# Patient Record
Sex: Male | Born: 1960 | Race: White | Hispanic: No | Marital: Married | Smoking: Former smoker
Health system: Southern US, Community
[De-identification: ages and names within clinical notes are randomized; demographics above are authoritative.]

## PROBLEM LIST (undated history)

## (undated) DIAGNOSIS — M109 Gout, unspecified: Secondary | ICD-10-CM

## (undated) DIAGNOSIS — I10 Essential (primary) hypertension: Secondary | ICD-10-CM

## (undated) DIAGNOSIS — Y249XXA Unspecified firearm discharge, undetermined intent, initial encounter: Secondary | ICD-10-CM

## (undated) DIAGNOSIS — K529 Noninfective gastroenteritis and colitis, unspecified: Secondary | ICD-10-CM

## (undated) DIAGNOSIS — E785 Hyperlipidemia, unspecified: Secondary | ICD-10-CM

## (undated) DIAGNOSIS — K219 Gastro-esophageal reflux disease without esophagitis: Secondary | ICD-10-CM

## (undated) DIAGNOSIS — W3400XA Accidental discharge from unspecified firearms or gun, initial encounter: Secondary | ICD-10-CM

## (undated) HISTORY — PX: OTHER SURGICAL HISTORY: SHX169

---

## 2006-05-31 ENCOUNTER — Ambulatory Visit: Payer: Self-pay | Admitting: Unknown Physician Specialty

## 2006-06-19 ENCOUNTER — Ambulatory Visit: Payer: Self-pay | Admitting: Unknown Physician Specialty

## 2012-01-27 ENCOUNTER — Ambulatory Visit: Payer: Self-pay | Admitting: Unknown Physician Specialty

## 2013-02-21 ENCOUNTER — Ambulatory Visit: Payer: Self-pay | Admitting: Unknown Physician Specialty

## 2013-02-25 LAB — PATHOLOGY REPORT

## 2014-05-06 ENCOUNTER — Ambulatory Visit: Payer: Self-pay | Admitting: Unknown Physician Specialty

## 2014-06-16 ENCOUNTER — Ambulatory Visit
Admit: 2014-06-16 | Disposition: A | Discharge status: Home / Self Care | Payer: Self-pay | Attending: Unknown Physician Specialty | Admitting: Unknown Physician Specialty

## 2018-12-05 ENCOUNTER — Other Ambulatory Visit: Payer: Self-pay

## 2018-12-05 DIAGNOSIS — Z20822 Contact with and (suspected) exposure to covid-19: Secondary | ICD-10-CM

## 2018-12-07 LAB — NOVEL CORONAVIRUS, NAA: SARS-CoV-2, NAA: NOT DETECTED

## 2019-01-23 ENCOUNTER — Other Ambulatory Visit: Payer: Self-pay | Admitting: Student

## 2019-01-23 DIAGNOSIS — M545 Low back pain, unspecified: Secondary | ICD-10-CM

## 2019-02-03 ENCOUNTER — Ambulatory Visit: Admission: RE | Admit: 2019-02-03 | Payer: Self-pay | Source: Ambulatory Visit

## 2019-06-05 ENCOUNTER — Ambulatory Visit: Admit: 2019-06-05 | Payer: Self-pay | Admitting: Internal Medicine

## 2019-06-05 SURGERY — COLONOSCOPY WITH PROPOFOL
Anesthesia: General

## 2019-07-14 ENCOUNTER — Emergency Department (HOSPITAL_COMMUNITY): Payer: BLUE CROSS/BLUE SHIELD

## 2019-07-14 ENCOUNTER — Encounter (HOSPITAL_COMMUNITY): Payer: Self-pay | Admitting: Emergency Medicine

## 2019-07-14 ENCOUNTER — Other Ambulatory Visit: Payer: Self-pay

## 2019-07-14 ENCOUNTER — Emergency Department (HOSPITAL_COMMUNITY)
Admission: EM | Admit: 2019-07-14 | Discharge: 2019-07-15 | Disposition: A | Discharge status: Home / Self Care | Payer: BLUE CROSS/BLUE SHIELD | Attending: Emergency Medicine | Admitting: Emergency Medicine

## 2019-07-14 DIAGNOSIS — W3409XA Accidental discharge from other specified firearms, initial encounter: Secondary | ICD-10-CM | POA: Diagnosis not present

## 2019-07-14 DIAGNOSIS — Y929 Unspecified place or not applicable: Secondary | ICD-10-CM | POA: Diagnosis not present

## 2019-07-14 DIAGNOSIS — S81831A Puncture wound without foreign body, right lower leg, initial encounter: Secondary | ICD-10-CM | POA: Diagnosis present

## 2019-07-14 DIAGNOSIS — Y999 Unspecified external cause status: Secondary | ICD-10-CM | POA: Insufficient documentation

## 2019-07-14 DIAGNOSIS — I10 Essential (primary) hypertension: Secondary | ICD-10-CM | POA: Diagnosis not present

## 2019-07-14 DIAGNOSIS — R21 Rash and other nonspecific skin eruption: Secondary | ICD-10-CM | POA: Insufficient documentation

## 2019-07-14 DIAGNOSIS — W3400XA Accidental discharge from unspecified firearms or gun, initial encounter: Secondary | ICD-10-CM

## 2019-07-14 DIAGNOSIS — Y939 Activity, unspecified: Secondary | ICD-10-CM | POA: Insufficient documentation

## 2019-07-14 HISTORY — DX: Noninfective gastroenteritis and colitis, unspecified: K52.9

## 2019-07-14 HISTORY — DX: Essential (primary) hypertension: I10

## 2019-07-14 HISTORY — DX: Hyperlipidemia, unspecified: E78.5

## 2019-07-14 MED ORDER — FENTANYL CITRATE (PF) 100 MCG/2ML IJ SOLN: 75.0000 ug | Freq: Once | INTRAMUSCULAR | Status: DC

## 2019-07-14 MED ORDER — CEPHALEXIN 500 MG PO CAPS: 500.0000 mg | ORAL_CAPSULE | Freq: Four times a day (QID) | ORAL | 0 refills | Status: DC
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
## 2019-07-14 NOTE — ED Notes (Signed)
Pt ambulated in hall. Pt complained of muscle tightness in the hamstring of the R. Leg.
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
## 2019-07-16 ENCOUNTER — Emergency Department (HOSPITAL_COMMUNITY)
Admission: EM | Admit: 2019-07-16 | Discharge: 2019-07-16 | Disposition: A | Discharge status: Home / Self Care | Payer: BLUE CROSS/BLUE SHIELD | Attending: Emergency Medicine | Admitting: Emergency Medicine

## 2019-07-16 ENCOUNTER — Emergency Department (HOSPITAL_COMMUNITY): Payer: BLUE CROSS/BLUE SHIELD

## 2019-07-16 ENCOUNTER — Encounter (HOSPITAL_COMMUNITY): Payer: Self-pay | Admitting: Emergency Medicine

## 2019-07-16 ENCOUNTER — Emergency Department (HOSPITAL_BASED_OUTPATIENT_CLINIC_OR_DEPARTMENT_OTHER): Payer: BLUE CROSS/BLUE SHIELD

## 2019-07-16 DIAGNOSIS — M795 Residual foreign body in soft tissue: Secondary | ICD-10-CM | POA: Diagnosis not present

## 2019-07-16 DIAGNOSIS — I10 Essential (primary) hypertension: Secondary | ICD-10-CM | POA: Insufficient documentation

## 2019-07-16 DIAGNOSIS — W3400XD Accidental discharge from unspecified firearms or gun, subsequent encounter: Secondary | ICD-10-CM | POA: Diagnosis not present

## 2019-07-16 DIAGNOSIS — L538 Other specified erythematous conditions: Secondary | ICD-10-CM | POA: Diagnosis not present

## 2019-07-16 DIAGNOSIS — M79609 Pain in unspecified limb: Secondary | ICD-10-CM

## 2019-07-16 DIAGNOSIS — R52 Pain, unspecified: Secondary | ICD-10-CM
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
## 2019-07-16 NOTE — ED Notes (Signed)
Vascular at bedside
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
## 2019-07-16 NOTE — ED Notes (Signed)
Pt in radiology 

## 2019-07-16 NOTE — Progress Notes (Signed)
Right lower extremity venous duplex complete.  Please see CV proc tab for preliminary results. Lita Mains- RDMS, RVT 8:05 PM  07/16/2019

## 2021-04-05 DIAGNOSIS — R7303 Prediabetes: Secondary | ICD-10-CM | POA: Diagnosis not present

## 2021-04-05 DIAGNOSIS — E538 Deficiency of other specified B group vitamins: Secondary | ICD-10-CM | POA: Diagnosis not present

## 2021-04-06 DIAGNOSIS — G4733 Obstructive sleep apnea (adult) (pediatric): Secondary | ICD-10-CM | POA: Diagnosis not present

## 2021-04-12 DIAGNOSIS — M109 Gout, unspecified: Secondary | ICD-10-CM | POA: Diagnosis not present

## 2021-04-12 DIAGNOSIS — K760 Fatty (change of) liver, not elsewhere classified: Secondary | ICD-10-CM | POA: Diagnosis not present

## 2021-04-12 DIAGNOSIS — E7800 Pure hypercholesterolemia, unspecified: Secondary | ICD-10-CM | POA: Diagnosis not present

## 2021-04-12 DIAGNOSIS — I10 Essential (primary) hypertension: Secondary | ICD-10-CM | POA: Diagnosis not present

## 2021-05-07 DIAGNOSIS — G4733 Obstructive sleep apnea (adult) (pediatric): Secondary | ICD-10-CM | POA: Diagnosis not present

## 2021-05-07 DIAGNOSIS — Z0001 Encounter for general adult medical examination with abnormal findings: Secondary | ICD-10-CM | POA: Diagnosis not present

## 2021-05-07 DIAGNOSIS — Z125 Encounter for screening for malignant neoplasm of prostate: Secondary | ICD-10-CM | POA: Diagnosis not present

## 2021-06-04 DIAGNOSIS — G4733 Obstructive sleep apnea (adult) (pediatric): Secondary | ICD-10-CM | POA: Diagnosis not present

## 2021-06-29 DIAGNOSIS — G4733 Obstructive sleep apnea (adult) (pediatric): Secondary | ICD-10-CM | POA: Diagnosis not present

## 2021-07-01 DIAGNOSIS — K5150 Left sided colitis without complications: Secondary | ICD-10-CM | POA: Diagnosis not present

## 2021-07-05 DIAGNOSIS — G4733 Obstructive sleep apnea (adult) (pediatric): Secondary | ICD-10-CM | POA: Diagnosis not present

## 2021-08-04 DIAGNOSIS — G4733 Obstructive sleep apnea (adult) (pediatric): Secondary | ICD-10-CM | POA: Diagnosis not present
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
## 2021-08-05 DIAGNOSIS — G4733 Obstructive sleep apnea (adult) (pediatric): Secondary | ICD-10-CM | POA: Diagnosis not present

## 2021-08-26 DIAGNOSIS — K5150 Left sided colitis without complications: Secondary | ICD-10-CM | POA: Diagnosis not present

## 2021-09-04 DIAGNOSIS — G4733 Obstructive sleep apnea (adult) (pediatric): Secondary | ICD-10-CM | POA: Diagnosis not present

## 2021-10-04 DIAGNOSIS — G4733 Obstructive sleep apnea (adult) (pediatric): Secondary | ICD-10-CM | POA: Diagnosis not present

## 2021-10-11 DIAGNOSIS — I10 Essential (primary) hypertension: Secondary | ICD-10-CM | POA: Diagnosis not present

## 2021-10-11 DIAGNOSIS — M109 Gout, unspecified: Secondary | ICD-10-CM | POA: Diagnosis not present

## 2021-10-11 DIAGNOSIS — E538 Deficiency of other specified B group vitamins: Secondary | ICD-10-CM | POA: Diagnosis not present

## 2021-10-11 DIAGNOSIS — E7800 Pure hypercholesterolemia, unspecified: Secondary | ICD-10-CM | POA: Diagnosis not present

## 2021-10-11 DIAGNOSIS — K5150 Left sided colitis without complications: Secondary | ICD-10-CM | POA: Diagnosis not present

## 2021-10-11 DIAGNOSIS — R400 Somnolence: Secondary | ICD-10-CM | POA: Diagnosis not present

## 2021-10-11 DIAGNOSIS — Z125 Encounter for screening for malignant neoplasm of prostate: Secondary | ICD-10-CM | POA: Diagnosis not present

## 2021-10-18 DIAGNOSIS — K5150 Left sided colitis without complications: Secondary | ICD-10-CM | POA: Diagnosis not present

## 2021-10-18 DIAGNOSIS — K5130 Ulcerative (chronic) rectosigmoiditis without complications: Secondary | ICD-10-CM | POA: Diagnosis not present

## 2021-10-18 DIAGNOSIS — Z79899 Other long term (current) drug therapy: Secondary | ICD-10-CM | POA: Diagnosis not present

## 2021-11-04 DIAGNOSIS — G4733 Obstructive sleep apnea (adult) (pediatric): Secondary | ICD-10-CM | POA: Diagnosis not present

## 2021-12-05 DIAGNOSIS — G4733 Obstructive sleep apnea (adult) (pediatric): Secondary | ICD-10-CM | POA: Diagnosis not present

## 2022-01-04 DIAGNOSIS — G4733 Obstructive sleep apnea (adult) (pediatric): Secondary | ICD-10-CM | POA: Diagnosis not present

## 2022-01-10 ENCOUNTER — Ambulatory Visit: Payer: 59 | Admitting: Dermatology

## 2022-01-10 DIAGNOSIS — B353 Tinea pedis: Secondary | ICD-10-CM

## 2022-01-10 DIAGNOSIS — Z79899 Other long term (current) drug therapy: Secondary | ICD-10-CM | POA: Diagnosis not present

## 2022-01-10 DIAGNOSIS — L821 Other seborrheic keratosis: Secondary | ICD-10-CM

## 2022-01-10 DIAGNOSIS — B351 Tinea unguium: Secondary | ICD-10-CM | POA: Diagnosis not present

## 2022-01-10 DIAGNOSIS — D229 Melanocytic nevi, unspecified: Secondary | ICD-10-CM | POA: Diagnosis not present

## 2022-01-10 DIAGNOSIS — D3610 Benign neoplasm of peripheral nerves and autonomic nervous system, unspecified: Secondary | ICD-10-CM

## 2022-01-10 DIAGNOSIS — D3612 Benign neoplasm of peripheral nerves and autonomic nervous system, upper limb, including shoulder: Secondary | ICD-10-CM

## 2022-01-10 DIAGNOSIS — Z1283 Encounter for screening for malignant neoplasm of skin: Secondary | ICD-10-CM | POA: Diagnosis not present

## 2022-01-10 DIAGNOSIS — L814 Other melanin hyperpigmentation: Secondary | ICD-10-CM

## 2022-01-10 DIAGNOSIS — Z808 Family history of malignant neoplasm of other organs or systems: Secondary | ICD-10-CM

## 2022-01-10 DIAGNOSIS — L820 Inflamed seborrheic keratosis: Secondary | ICD-10-CM

## 2022-01-10 DIAGNOSIS — L578 Other skin changes due to chronic exposure to nonionizing radiation: Secondary | ICD-10-CM

## 2022-01-10 MED ORDER — TERBINAFINE HCL 250 MG PO TABS: 250.0000 mg | ORAL_TABLET | Freq: Every day | ORAL | 0 refills | Status: AC

## 2022-01-10 NOTE — Patient Instructions (Addendum)
Terbinafine Counseling  Terbinafine is an anti-fungal medicine that can be applied to the skin (over the counter) or taken by mouth (prescription) to treat fungal infections. The pill version is often used to treat fungal infections of the nails or scalp. While most people do not have any side effects from taking terbinafine pills, some possible side effects of the medicine can include taste changes, headache, loss of smell, vision changes, nausea, vomiting, or diarrhea.   Rare side effects can include irritation of the liver, allergic reaction, or decrease in blood counts (which may show up as not feeling well or developing an infection). If you are concerned about any of these side effects, please stop the medicine and call your doctor, or in the case of an emergency such as feeling very unwell, seek immediate medical care.      Seborrheic Keratosis  What causes seborrheic keratoses? Seborrheic keratoses are harmless, common skin growths that first appear during adult life.  As time goes by, more growths appear.  Some people may develop a large number of them.  Seborrheic keratoses appear on both covered and uncovered body parts.  They are not caused by sunlight.  The tendency to develop seborrheic keratoses can be inherited.  They vary in color from skin-colored to gray, brown, or even black.  They can be either smooth or have a rough, warty surface.   Seborrheic keratoses are superficial and look as if they were stuck on the skin.  Under the microscope this type of keratosis looks like layers upon layers of skin.  That is why at times the top layer may seem to fall off, but the rest of the growth remains and re-grows.    Treatment Seborrheic keratoses do not need to be treated, but can easily be removed in the office.  Seborrheic keratoses often cause symptoms when they rub on clothing or jewelry.  Lesions can be in the way of shaving.  If they become inflamed, they can cause itching, soreness,  or burning.  Removal of a seborrheic keratosis can be accomplished by freezing, burning, or surgery. If any spot bleeds, scabs, or grows rapidly, please return to have it checked, as these can be an indication of a skin cancer.  Cryotherapy Aftercare  Wash gently with soap and water everyday.   Apply Vaseline and Band-Aid daily until healed.     Melanoma ABCDEs  Melanoma is the most dangerous type of skin cancer, and is the leading cause of death from skin disease.  You are more likely to develop melanoma if you: Have light-colored skin, light-colored eyes, or red or blond hair Spend a lot of time in the sun Tan regularly, either outdoors or in a tanning bed Have had blistering sunburns, especially during childhood Have a close family member who has had a melanoma Have atypical moles or large birthmarks  Early detection of melanoma is key since treatment is typically straightforward and cure rates are extremely high if we catch it early.   The first sign of melanoma is often a change in a mole or a new dark spot.  The ABCDE system is a way of remembering the signs of melanoma.  A for asymmetry:  The two halves do not match. B for border:  The edges of the growth are irregular. C for color:  A mixture of colors are present instead of an even brown color. D for diameter:  Melanomas are usually (but not always) greater than 40m - the size of a  pencil eraser. E for evolution:  The spot keeps changing in size, shape, and color.  Please check your skin once per month between visits. You can use a small mirror in front and a large mirror behind you to keep an eye on the back side or your body.   If you see any new or changing lesions before your next follow-up, please call to schedule a visit.  Please continue daily skin protection including broad spectrum sunscreen SPF 30+ to sun-exposed areas, reapplying every 2 hours as needed when you're outdoors.   Staying in the shade or wearing  long sleeves, sun glasses (UVA+UVB protection) and wide brim hats (4-inch brim around the entire circumference of the hat) are also recommended for sun protection.    Due to recent changes in healthcare laws, you may see results of your pathology and/or laboratory studies on MyChart before the doctors have had a chance to review them. We understand that in some cases there may be results that are confusing or concerning to you. Please understand that not all results are received at the same time and often the doctors may need to interpret multiple results in order to provide you with the best plan of care or course of treatment. Therefore, we ask that you please give [place] 2 business days to thoroughly review all your results before contacting the office for clarification. Should we see a critical lab result, you will be contacted sooner.   If You Need Anything After Your Visit  If you have any questions or concerns for your doctor, please call our main line at [phone] and press option 4 to reach your doctor's medical assistant. If no one answers, please leave a voicemail as directed and we will return your call as soon as possible. Messages left after 4 pm will be answered the following business day.   You may also send [place] a message via [place]. We typically respond to MyChart messages within 1-2 business days.  For prescription refills, please ask your pharmacy to contact our office. Our fax number is [phone].  If you have an urgent issue when the clinic is closed that cannot wait until the next business day, you can page your doctor at the number below.    Please note that while we do our best to be available for urgent issues outside of office hours, we are not available 24/7.   If you have an urgent issue and are unable to reach [place], you may choose to seek medical care at your doctor's office, retail clinic, urgent care center, or emergency room.  If you have a medical emergency, please  immediately call 911 or go to the emergency department.  Pager Numbers  - Dr. Nehemiah Massed: [phone]  - Dr. Laurence Ferrari: [phone]  - Dr. Nicole Kindred: [phone]  In the event of inclement weather, please call our main line at [phone] for an update on the status of any delays or closures.  Dermatology Medication Tips: Please keep the boxes that topical medications come in in order to help keep track of the instructions about where and how to use these. Pharmacies typically print the medication instructions only on the boxes and not directly on the medication tubes.   If your medication is too expensive, please contact our office at [phone] option 4 or send [place] a message through [place].   We are unable to tell what your co-pay for medications will be in advance as this is different depending on your insurance coverage. However,  we may be able to find a substitute medication at lower cost or fill out paperwork to get insurance to cover a needed medication.   If a prior authorization is required to get your medication covered by your insurance company, please allow [place] 1-2 business days to complete this process.  Drug prices often vary depending on where the prescription is filled and some pharmacies may offer cheaper prices.  The website www.goodrx.com contains coupons for medications through different pharmacies. The prices here do not account for what the cost may be with help from insurance (it may be cheaper with your insurance), but the website can give you the price if you did not use any insurance.  - You can print the associated coupon and take it with your prescription to the pharmacy.  - You may also stop by our office during regular business hours and pick up a GoodRx coupon card.  - If you need your prescription sent electronically to a different pharmacy, notify our office through [facility] or by phone at [phone] option 4.     Si Usted Necesita Algo Despus  de Su Visita  Tambin puede enviarnos un mensaje a travs de Pharmacist, community. Por lo general respondemos a los mensajes de MyChart en el transcurso de 1 a 2 das hbiles.  Para renovar recetas, por favor pida a su farmacia que se ponga en contacto con nuestra oficina. Harland Dingwall de fax es [place] [phone].  Si tiene un asunto urgente cuando la clnica est cerrada y que no puede esperar hasta el siguiente da hbil, puede llamar/localizar a su doctor(a) al nmero que aparece a continuacin.   Por favor, tenga en cuenta que aunque hacemos todo lo posible para estar disponibles para asuntos urgentes fuera del horario de [place], no estamos disponibles las 24 horas del da, los 7 das de la [place].   Si tiene un problema urgente y no puede comunicarse con nosotros, puede optar por buscar atencin mdica  en el consultorio de su doctor(a), en una clnica privada, en un centro de atencin urgente o en una sala de emergencias.  Si tiene Engineering geologist, por favor llame inmediatamente al 911 o vaya a la sala de emergencias.  Nmeros de bper  - Dr. Nehemiah Massed: [phone]  - Dra. Moye: [phone]  - Dra. Nicole Kindred: [phone]  En caso de inclemencias del [place], por favor llame a Johnsie Kindred principal al [phone] para una actualizacin sobre el [place] de cualquier retraso o cierre.  Consejos para la medicacin en dermatologa: Por favor, guarde las cajas en las que vienen los medicamentos de uso tpico para ayudarle a seguir las instrucciones sobre dnde y cmo usarlos. Las farmacias generalmente imprimen las instrucciones del medicamento slo en las cajas y no directamente en los tubos del [place].   Si su medicamento es muy caro, por favor, pngase en contacto con Zigmund Daniel llamando al [phone] y presione la opcin 4 o envenos un mensaje a travs de Pharmacist, community.   No podemos decirle cul ser su copago por los medicamentos por adelantado ya que esto es diferente dependiendo  de la cobertura de su seguro. Sin embargo, es posible que podamos encontrar un medicamento sustituto a Electrical engineer un formulario para que el seguro cubra el medicamento que se considera necesario.   Si se requiere una autorizacin previa para que su compaa de seguros [place] su medicamento, por favor permtanos de 1 a 2 das hbiles para completar este proceso.  Los precios de los  medicamentos varan con frecuencia dependiendo del lugar de dnde se surte la receta y alguna farmacias pueden ofrecer precios ms baratos.  El sitio web www.goodrx.com tiene cupones para medicamentos de Airline pilot. Los precios aqu no tienen en cuenta lo que podra costar con la ayuda del seguro (puede ser ms barato con su seguro), pero el sitio web puede darle el precio si no utiliz Research scientist (physical sciences).  - Puede imprimir el cupn correspondiente y llevarlo con su receta a la farmacia.  - Tambin puede pasar por nuestra oficina durante el horario de atencin regular y Charity fundraiser una tarjeta de cupones de GoodRx.  - Si necesita que su receta se enve electrnicamente a una farmacia diferente, informe a nuestra oficina a travs de MyChart de [place] o por telfono llamando al [phone] y presione la opcin 4.

## 2022-01-10 NOTE — Progress Notes (Unsigned)
New Patient Visit  Subjective  Larry Spencer is a 61 y.o. male who presents for the following: Annual Exam (Tbse, dad had some skin cancer, brother skin cancer. ). The patient presents for Total-Body Skin Exam (TBSE) for skin cancer screening and mole check.  The patient has spots, moles and lesions to be evaluated, some may be new or changing and the patient has concerns that these could be cancer.  The following portions of the chart were reviewed this encounter and updated as appropriate:   Allergies  Meds  Problems  Med Hx  Surg Hx  Fam Hx     Review of Systems:  No other skin or systemic complaints except as noted in HPI or Assessment and Plan.  Objective  Well appearing patient in no apparent distress; mood and affect are within normal limits.  A full examination was performed including scalp, head, eyes, ears, nose, lips, neck, chest, axillae, abdomen, back, buttocks, bilateral upper extremities, bilateral lower extremities, hands, feet, fingers, toes, fingernails, and toenails. All findings within normal limits unless otherwise noted below.  right vertex scalp x 1, left lateral scapula  x 1 (2) Erythematous stuck-on, waxy papule or plaque  b/l feet and toenails Scaling and maceration web spaces and over distal and lateral soles.    Assessment & Plan  Inflamed seborrheic keratosis (2) right vertex scalp x 1, left lateral scapula  x 1 Symptomatic, irritating, patient would like treated. Destruction of lesion - right vertex scalp x 1, left lateral scapula  x 1 Complexity: simple   Destruction method: cryotherapy   Informed consent: discussed and consent obtained   Timeout:  patient name, date of birth, surgical site, and procedure verified Lesion destroyed using liquid nitrogen: Yes   Region frozen until ice ball extended beyond lesion: Yes   Outcome: patient tolerated procedure well with no complications   Post-procedure details: wound care instructions given    Additional details:  Prior to procedure, discussed risks of blister formation, small wound, skin dyspigmentation, or rare scar following cryotherapy. Recommend Vaseline ointment to treated areas while healing.  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 Condition is symptomatic / bothersome to patient. Not to goal.  Reviewed patient most recent labs - normal   Start Terbinafine 250 mg tab - take 1 tab by mouth daily for 1 month.   Patient instructed to call after finishing medication.   Terbinafine Counseling  Terbinafine is an anti-fungal medicine that can be applied to the skin (over the counter) or taken by mouth (prescription) to treat fungal infections. The pill version is often used to treat fungal infections of the nails or scalp. While most people do not have any side effects from taking terbinafine pills, some possible side effects of the medicine can include taste changes, headache, loss of smell, vision changes, nausea, vomiting, or diarrhea.   Rare side effects can include irritation of the liver, allergic reaction, or decrease in blood counts (which may show up as not feeling well or developing an infection). If you are concerned about any of these side effects, please stop the medicine and call your doctor, or in the case of an emergency such as feeling very unwell, seek immediate medical care.   terbinafine (LAMISIL) 250 MG tablet - b/l feet and toenails Take 1 tablet (250 mg total) by mouth daily.  Neurofibroma Left Shoulder - Anterior Benign-appearing.  Observation.  Call clinic for new or changing lesions.  Recommend daily use of broad spectrum spf 30+ sunscreen to sun-exposed areas.    Lentigines - Scattered tan macules - Due to sun exposure - Benign-appearing, observe - Recommend daily broad spectrum sunscreen SPF 30+ to sun-exposed areas, reapply every 2 hours  as needed. - Call for any changes  Seborrheic Keratoses - Stuck-on, waxy, tan-brown papules and/or plaques  - Benign-appearing - Discussed benign etiology and prognosis. - Observe - Call for any changes  Melanocytic Nevi - Tan-brown and/or pink-flesh-colored symmetric macules and papules - Benign appearing on exam today - Observation - Call clinic for new or changing moles - Recommend daily use of broad spectrum spf 30+ sunscreen to sun-exposed areas.   Hemangiomas - Red papules - Discussed benign nature - Observe - Call for any changes  Actinic Damage - Chronic condition, secondary to cumulative UV/sun exposure - diffuse scaly erythematous macules with underlying dyspigmentation - Recommend daily broad spectrum sunscreen SPF 30+ to sun-exposed areas, reapply every 2 hours as needed.  - Staying in the shade or wearing long sleeves, sun glasses (UVA+UVB protection) and wide brim hats (4-inch brim around the entire circumference of the hat) are also recommended for sun protection.  - Call for new or changing lesions.  Family history of skin cancer - what type(s): unsure  - who affected: dad and brother   Skin cancer screening performed today. Return in about 1 year (around 01/11/2023) for TBSE.  [name], CMA, am acting as scribe for Sarina Ser, MD. Documentation: I have reviewed the above documentation for accuracy and completeness, and I agree with the above.  Sarina Ser, MD

## 2022-01-11 ENCOUNTER — Encounter: Payer: Self-pay | Admitting: Dermatology

## 2022-01-17 DIAGNOSIS — G8929 Other chronic pain: Secondary | ICD-10-CM | POA: Diagnosis not present

## 2022-01-17 DIAGNOSIS — M19041 Primary osteoarthritis, right hand: Secondary | ICD-10-CM | POA: Diagnosis not present

## 2022-01-17 DIAGNOSIS — M19042 Primary osteoarthritis, left hand: Secondary | ICD-10-CM | POA: Diagnosis not present

## 2022-01-17 DIAGNOSIS — M79641 Pain in right hand: Secondary | ICD-10-CM | POA: Diagnosis not present

## 2022-01-17 DIAGNOSIS — M79642 Pain in left hand: Secondary | ICD-10-CM | POA: Diagnosis not present

## 2022-01-28 DIAGNOSIS — H52223 Regular astigmatism, bilateral: Secondary | ICD-10-CM | POA: Diagnosis not present

## 2022-01-28 DIAGNOSIS — H353131 Nonexudative age-related macular degeneration, bilateral, early dry stage: Secondary | ICD-10-CM | POA: Diagnosis not present

## 2022-01-28 DIAGNOSIS — H2513 Age-related nuclear cataract, bilateral: Secondary | ICD-10-CM | POA: Diagnosis not present

## 2022-01-28 DIAGNOSIS — H524 Presbyopia: Secondary | ICD-10-CM | POA: Diagnosis not present

## 2022-01-28 DIAGNOSIS — H5203 Hypermetropia, bilateral: Secondary | ICD-10-CM | POA: Diagnosis not present

## 2022-02-04 DIAGNOSIS — G4733 Obstructive sleep apnea (adult) (pediatric): Secondary | ICD-10-CM | POA: Diagnosis not present

## 2022-03-06 DIAGNOSIS — G4733 Obstructive sleep apnea (adult) (pediatric): Secondary | ICD-10-CM | POA: Diagnosis not present

## 2022-04-06 DIAGNOSIS — G4733 Obstructive sleep apnea (adult) (pediatric): Secondary | ICD-10-CM | POA: Diagnosis not present

## 2022-04-07 DIAGNOSIS — K5150 Left sided colitis without complications: Secondary | ICD-10-CM | POA: Diagnosis not present

## 2022-05-18 ENCOUNTER — Encounter: Payer: Self-pay | Admitting: Gastroenterology
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
## 2022-05-19 ENCOUNTER — Encounter: Payer: Self-pay | Admitting: Gastroenterology

## 2022-05-19 ENCOUNTER — Ambulatory Visit: Payer: 59 | Admitting: Certified Registered Nurse Anesthetist

## 2022-05-19 ENCOUNTER — Other Ambulatory Visit: Payer: Self-pay

## 2022-05-19 ENCOUNTER — Encounter
Admission: RE | Disposition: A | Discharge status: Home / Self Care | Payer: Self-pay | Source: Home / Self Care | Attending: Gastroenterology

## 2022-05-19 ENCOUNTER — Ambulatory Visit
Admission: RE | Admit: 2022-05-19 | Discharge: 2022-05-19 | Disposition: A | Discharge status: Home / Self Care | Payer: 59 | Attending: Gastroenterology | Admitting: Gastroenterology

## 2022-05-19 DIAGNOSIS — M109 Gout, unspecified: Secondary | ICD-10-CM | POA: Insufficient documentation

## 2022-05-19 DIAGNOSIS — K644 Residual hemorrhoidal skin tags: Secondary | ICD-10-CM | POA: Insufficient documentation

## 2022-05-19 DIAGNOSIS — K219 Gastro-esophageal reflux disease without esophagitis: Secondary | ICD-10-CM | POA: Insufficient documentation

## 2022-05-19 DIAGNOSIS — E785 Hyperlipidemia, unspecified: Secondary | ICD-10-CM | POA: Insufficient documentation

## 2022-05-19 DIAGNOSIS — Z79899 Other long term (current) drug therapy: Secondary | ICD-10-CM | POA: Diagnosis not present

## 2022-05-19 DIAGNOSIS — K635 Polyp of colon: Secondary | ICD-10-CM | POA: Diagnosis not present

## 2022-05-19 DIAGNOSIS — Z09 Encounter for follow-up examination after completed treatment for conditions other than malignant neoplasm: Secondary | ICD-10-CM | POA: Insufficient documentation

## 2022-05-19 DIAGNOSIS — Z87891 Personal history of nicotine dependence: Secondary | ICD-10-CM | POA: Insufficient documentation

## 2022-05-19 DIAGNOSIS — D120 Benign neoplasm of cecum: Secondary | ICD-10-CM | POA: Insufficient documentation

## 2022-05-19 DIAGNOSIS — L918 Other hypertrophic disorders of the skin: Secondary | ICD-10-CM | POA: Diagnosis not present

## 2022-05-19 DIAGNOSIS — K5150 Left sided colitis without complications: Secondary | ICD-10-CM | POA: Insufficient documentation

## 2022-05-19 DIAGNOSIS — I10 Essential (primary) hypertension: Secondary | ICD-10-CM | POA: Diagnosis not present

## 2022-05-19 DIAGNOSIS — K640 First degree hemorrhoids: Secondary | ICD-10-CM | POA: Insufficient documentation

## 2022-05-19 DIAGNOSIS — K5730 Diverticulosis of large intestine without perforation or abscess without bleeding: Secondary | ICD-10-CM | POA: Diagnosis not present

## 2022-05-19 DIAGNOSIS — Z1211 Encounter for screening for malignant neoplasm of colon: Secondary | ICD-10-CM | POA: Insufficient documentation

## 2022-05-19 DIAGNOSIS — K5130 Ulcerative (chronic) rectosigmoiditis without complications: Secondary | ICD-10-CM | POA: Insufficient documentation

## 2022-05-19 DIAGNOSIS — K649 Unspecified hemorrhoids: Secondary | ICD-10-CM | POA: Diagnosis not present

## 2022-05-19 HISTORY — PX: COLONOSCOPY: SHX5424

## 2022-05-19 HISTORY — DX: Unspecified firearm discharge, undetermined intent, initial encounter: Y24.9XXA

## 2022-05-19 HISTORY — DX: Gout, unspecified: M10.9

## 2022-05-19 HISTORY — DX: Gastro-esophageal reflux disease without esophagitis: K21.9

## 2022-05-19 HISTORY — DX: Accidental discharge from unspecified firearms or gun, initial encounter: W34.00XA

## 2022-05-19 SURGERY — COLONOSCOPY
Anesthesia: General

## 2022-05-19 MED ORDER — SODIUM CHLORIDE 0.9 % IV SOLN: INTRAVENOUS | Status: DC

## 2022-05-19 MED ORDER — LIDOCAINE HCL (PF) 2 % IJ SOLN
INTRAMUSCULAR | Status: AC
  Filled 2022-05-19: qty 5

## 2022-05-19 MED ORDER — PROPOFOL 500 MG/50ML IV EMUL
INTRAVENOUS | Status: DC | PRN
  Administered 2022-05-19: 150 ug/kg/min via INTRAVENOUS

## 2022-05-19 MED ORDER — PROPOFOL 1000 MG/100ML IV EMUL
INTRAVENOUS | Status: AC
  Filled 2022-05-19: qty 100

## 2022-05-19 MED ORDER — PROPOFOL 10 MG/ML IV BOLUS
INTRAVENOUS | Status: DC | PRN
  Administered 2022-05-19: 20 mg via INTRAVENOUS
  Administered 2022-05-19: 90 mg via INTRAVENOUS

## 2022-05-19 MED ORDER — LIDOCAINE HCL (CARDIAC) PF 100 MG/5ML IV SOSY
PREFILLED_SYRINGE | INTRAVENOUS | Status: DC | PRN
  Administered 2022-05-19: 50 mg via INTRAVENOUS

## 2022-05-19 NOTE — Anesthesia Procedure Notes (Signed)
Date/Time: 05/19/2022 7:26 AM  Performed by: Johnna Acosta, CRNAPre-anesthesia Checklist: Emergency Drugs available, Patient identified, Suction available, Patient being monitored and Timeout performed Patient Re-evaluated:Patient Re-evaluated prior to induction Oxygen Delivery Method: Nasal cannula Preoxygenation: Pre-oxygenation with 100% oxygen Induction Type: IV induction
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
## 2022-05-19 NOTE — Op Note (Signed)
[facility] Gastroenterology Patient Name: Larry Spencer Procedure Date: 05/19/2022 6:55 AM MRN: [id number] Account #: [id number] Date of Birth: 06-14-1960 Admit Type: Outpatient Age: 62 Room: [facility] ENDO ROOM 1 Gender: Male Note Status: Finalized Instrument Name: Peds Colonoscope [id number] Procedure:             Colonoscopy Indications:           High risk colon cancer surveillance: Ulcerative left                         sided colitis of 8 (or more) years duration Providers:             Rueben Bash, DO Referring MD:          Annamaria Helling DO, DO (Referring MD), No Local                         Md, MD (Referring MD) Medicines:             Monitored Anesthesia Care Complications:         No immediate complications. Estimated blood loss:                         Minimal. Procedure:             Pre-Anesthesia Assessment:                        - Prior to the procedure, a History and Physical was                         performed, and patient medications and allergies were                         reviewed. The patient is competent. The risks and                         benefits of the procedure and the sedation options and                         risks were discussed with the patient. All questions                         were answered and informed consent was obtained.                         Patient identification and proposed procedure were                         verified by the physician, the nurse, the anesthetist                         and the technician in the endoscopy suite. Mental                         Status Examination: alert and oriented. Airway                         Examination: normal oropharyngeal airway and neck  mobility. Respiratory Examination: clear to                         auscultation. CV Examination: RRR, no murmurs, no S3                         or S4. Prophylactic Antibiotics: The patient does  not                         require prophylactic antibiotics. Prior                         Anticoagulants: The patient has taken no anticoagulant                         or antiplatelet agents. ASA Grade Assessment: II - A                         patient with mild systemic disease. After reviewing                         the risks and benefits, the patient was deemed in                         satisfactory condition to undergo the procedure. The                         anesthesia plan was to use monitored anesthesia care                         (MAC). Immediately prior to administration of                         medications, the patient was re-assessed for adequacy                         to receive sedatives. The heart rate, respiratory                         rate, oxygen saturations, blood pressure, adequacy of                         pulmonary ventilation, and response to care were                         monitored throughout the procedure. The physical                         status of the patient was re-assessed after the                         procedure.                        After obtaining informed consent, the colonoscope was                         passed under direct vision. Throughout the procedure,  the patient's blood pressure, pulse, and oxygen                         saturations were monitored continuously. The                         Colonoscope was introduced through the anus and                         advanced to the the terminal ileum, with                         identification of the appendiceal orifice and IC                         valve. The colonoscopy was performed without                         difficulty. The patient tolerated the procedure well.                         The quality of the bowel preparation was evaluated                         using the BBPS [facility] Bowel Preparation Scale) with                         scores of:  Right Colon = 2 (minor amount of residual                         staining, small fragments of stool and/or opaque                         liquid, but mucosa seen well), Transverse Colon = 3                         (entire mucosa seen well with no residual staining,                         small fragments of stool or opaque liquid) and Left                         Colon = 3 (entire mucosa seen well with no residual                         staining, small fragments of stool or opaque liquid).                         The total BBPS score equals 8. The quality of the                         bowel preparation was excellent. The terminal ileum,                         ileocecal valve, appendiceal orifice, and rectum were  photographed. Findings:      Skin tags were found on perianal exam.      The digital rectal exam was normal. Pertinent negatives include normal       sphincter tone.      The terminal ileum appeared normal. Estimated blood loss: none.      A 2 to 3 mm polyp was found in the cecum. The polyp was sessile. The       polyp was removed with a cold snare. Resection and retrieval were       complete. Estimated blood loss was minimal.      A 1 mm polyp was found in the cecum. The polyp was sessile. The polyp       was removed with a cold biopsy forceps. Resection and retrieval were       complete. Estimated blood loss was minimal.      Normal mucosa was found in the rectum, in the descending colon, in the       transverse colon, in the ascending colon and in the cecum. Biopsies were       taken with a cold forceps for histology. Random biospies taken for       disease surveillance and dysplasia surveillance      Multiple small-mouthed diverticula were found in the recto-sigmoid colon       and sigmoid colon. Estimated blood loss: none.      The mucosa vascular pattern in the recto-sigmoid colon and in the       sigmoid colon was locally decreased. This was  biopsied with a cold       forceps for ulcerative colitis surveillance. Estimated blood loss was       minimal.      A 24 mm polypoid lesion was found at 25 cm proximal to the anus. The       lesion was ulcerated and granular lateral spreading. No bleeding was       present. Biopsies were taken with a cold forceps for histology. This       lesion went around ~75% of the lumen and narrowed the lumen. Area was       tattooed with an injection of 1 mL of [place] ink. Tattoo placed proximal       to the lesion Estimated blood loss: none.      Non-bleeding internal hemorrhoids were found during retroflexion. The       hemorrhoids were Grade I (internal hemorrhoids that do not prolapse).       Estimated blood loss: none.      The exam was otherwise without abnormality on direct and retroflexion       views. Impression:            - Perianal skin tags found on perianal exam.                        - The examined portion of the ileum was normal.                        - One 2 to 3 mm polyp in the cecum, removed with a                         cold snare. Resected and retrieved.                        -  One 1 mm polyp in the cecum, removed with a cold                         biopsy forceps. Resected and retrieved.                        - Normal mucosa in the rectum, in the descending                         colon, in the transverse colon, in the ascending colon                         and in the cecum. Biopsied.                        - Diverticulosis in the recto-sigmoid colon and in the                         sigmoid colon.                        - Decreased mucosa vascular pattern in the                         recto-sigmoid colon and in the sigmoid colon. Biopsied.                        - Rule out malignancy, polypoid lesion at 25 cm                         proximal to the anus. Biopsied. Tattooed.                        - Non-bleeding internal hemorrhoids.                        - The  examination was otherwise normal on direct and                         retroflexion views. Recommendation:        - Patient has a contact number available for                         emergencies. The signs and symptoms of potential                         delayed complications were discussed with the patient.                         Return to normal activities tomorrow. Written                         discharge instructions were provided to the patient.                        - Discharge patient to home.                        - Resume previous diet.                        -  Continue present medications.                        - Await pathology results.                        - Repeat colonoscopy for surveillance based on                         pathology results.                        - Return to GI office as previously scheduled.                        - The findings and recommendations were discussed with                         the patient. Procedure Code(s):     --- Professional ---                        [phone], Colonoscopy, flexible; with removal of                         tumor(s), polyp(s), or other lesion(s) by snare                         technique                        45380, 59, Colonoscopy, flexible; with biopsy, single                         or multiple                        45381, Colonoscopy, flexible; with directed submucosal                         injection(s), any substance Diagnosis Code(s):     --- Professional ---                        K51.50, Left sided colitis without complications                        K64.0, First degree hemorrhoids                        D12.0, Benign neoplasm of cecum                        D49.0, Neoplasm of unspecified behavior of digestive                         system                        K64.4, Residual hemorrhoidal skin tags                        K57.30, Diverticulosis of large intestine without  perforation or abscess without bleeding CPT copyright 2022 American Medical Association. All rights reserved. The codes documented in this report are preliminary and upon coder review may  be revised to meet current compliance requirements. Attending Participation:      I personally performed the entire procedure. Volney American, DO Annamaria Helling DO, DO 05/19/2022 8:11:19 AM This report has been signed electronically. Number of Addenda: 0 Note Initiated On: 05/19/2022 6:55 AM Scope Withdrawal Time: 0 hours 31 minutes 50 seconds  Total Procedure Duration: 0 hours 37 minutes 22 seconds  Estimated Blood Loss:  Estimated blood loss was minimal.      [facility]

## 2022-05-19 NOTE — Interval H&P Note (Signed)
History and Physical Interval Note: Preprocedure H&P from 05/19/22  was reviewed and there was no interval change after seeing and examining the patient.  Written consent was obtained from the patient after discussion of risks, benefits, and alternatives. 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  The various methods of treatment have been discussed with the patient and family. After consideration of risks, benefits and other options for treatment, the patient has consented to  Procedure(s): COLONOSCOPY (N/A) as a surgical intervention.  The patient's history has been reviewed, patient examined, no change in status, stable for surgery.  I have reviewed the patient's chart and labs.  Questions were answered to the patient's satisfaction.     Annamaria Helling

## 2022-05-19 NOTE — Anesthesia Postprocedure Evaluation (Signed)
Anesthesia Post Note  Patient: Larry Spencer  Procedure(s) Performed: COLONOSCOPY  Patient location during evaluation: Endoscopy Anesthesia Type: General Level of consciousness: awake and alert Pain management: pain level controlled Vital Signs Assessment: post-procedure vital signs reviewed and stable Respiratory status: spontaneous breathing, nonlabored ventilation and respiratory function stable Cardiovascular status: blood pressure returned to baseline and stable Postop Assessment: no apparent nausea or vomiting Anesthetic complications: no   No notable events documented.   Last Vitals:  Vitals:   05/19/22 0812 05/19/22 0822  BP:    Pulse: 71 69  Resp:    Temp:    SpO2: 100% 100%    Last Pain:  Vitals:   05/19/22 0822  TempSrc:   PainSc: 0-No pain                 Alphonsus Sias

## 2022-05-19 NOTE — Transfer of Care (Signed)
Immediate Anesthesia Transfer of Care Note  Patient: Larry Spencer  Procedure(s) Performed: COLONOSCOPY  Patient Location: Endoscopy Unit  Anesthesia Type:General  Level of Consciousness: awake and drowsy  Airway & Oxygen Therapy: Patient Spontanous Breathing  Post-op Assessment: Report given to RN and Post -op Vital signs reviewed and stable  Post vital signs: Reviewed and stable  Last Vitals:  Vitals Value Taken Time  BP 125/93 05/19/22 0804  Temp 36.1 C 05/19/22 0802  Pulse 73 05/19/22 0802  Resp 16 05/19/22 0804  SpO2 97 % 05/19/22 0802    Last Pain:  Vitals:   05/19/22 0802  TempSrc: Temporal  PainSc: 0-No pain         Complications: No notable events documented.

## 2022-05-20 ENCOUNTER — Encounter: Payer: Self-pay | Admitting: Gastroenterology

## 2022-05-20 LAB — SURGICAL PATHOLOGY

## 2022-06-02 DIAGNOSIS — K5150 Left sided colitis without complications: Secondary | ICD-10-CM | POA: Diagnosis not present

## 2022-07-28 DIAGNOSIS — K5150 Left sided colitis without complications: Secondary | ICD-10-CM | POA: Diagnosis not present

## 2022-09-06 DIAGNOSIS — K5150 Left sided colitis without complications: Secondary | ICD-10-CM | POA: Diagnosis not present

## 2022-09-06 DIAGNOSIS — Z79899 Other long term (current) drug therapy: Secondary | ICD-10-CM | POA: Diagnosis not present

## 2022-09-22 DIAGNOSIS — K5150 Left sided colitis without complications: Secondary | ICD-10-CM | POA: Diagnosis not present

## 2022-10-19 DIAGNOSIS — K5150 Left sided colitis without complications: Secondary | ICD-10-CM | POA: Diagnosis not present

## 2022-10-19 DIAGNOSIS — Z79899 Other long term (current) drug therapy: Secondary | ICD-10-CM | POA: Diagnosis not present

## 2022-10-26 DIAGNOSIS — M109 Gout, unspecified: Secondary | ICD-10-CM | POA: Diagnosis not present

## 2022-10-26 DIAGNOSIS — I10 Essential (primary) hypertension: Secondary | ICD-10-CM | POA: Diagnosis not present

## 2022-10-26 DIAGNOSIS — E538 Deficiency of other specified B group vitamins: Secondary | ICD-10-CM | POA: Diagnosis not present

## 2022-10-26 DIAGNOSIS — E7800 Pure hypercholesterolemia, unspecified: Secondary | ICD-10-CM | POA: Diagnosis not present

## 2022-10-26 DIAGNOSIS — R7303 Prediabetes: Secondary | ICD-10-CM | POA: Diagnosis not present

## 2022-10-26 DIAGNOSIS — R972 Elevated prostate specific antigen [PSA]: Secondary | ICD-10-CM | POA: Diagnosis not present

## 2022-10-26 DIAGNOSIS — Z0000 Encounter for general adult medical examination without abnormal findings: Secondary | ICD-10-CM | POA: Diagnosis not present

## 2022-10-26 DIAGNOSIS — G4733 Obstructive sleep apnea (adult) (pediatric): Secondary | ICD-10-CM | POA: Diagnosis not present

## 2022-10-26 DIAGNOSIS — K5150 Left sided colitis without complications: Secondary | ICD-10-CM | POA: Diagnosis not present

## 2022-11-17 ENCOUNTER — Encounter: Payer: Self-pay | Admitting: Gastroenterology

## 2022-11-17 DIAGNOSIS — K5150 Left sided colitis without complications: Secondary | ICD-10-CM | POA: Diagnosis not present

## 2022-11-18 DIAGNOSIS — G4733 Obstructive sleep apnea (adult) (pediatric): Secondary | ICD-10-CM | POA: Diagnosis not present

## 2022-11-18 DIAGNOSIS — H9313 Tinnitus, bilateral: Secondary | ICD-10-CM | POA: Diagnosis not present

## 2022-11-18 DIAGNOSIS — H903 Sensorineural hearing loss, bilateral: Secondary | ICD-10-CM | POA: Diagnosis not present

## 2022-11-23 ENCOUNTER — Encounter: Payer: Self-pay | Admitting: Gastroenterology
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
## 2022-11-24 ENCOUNTER — Encounter
Admission: RE | Disposition: A | Discharge status: Home / Self Care | Payer: Self-pay | Source: Home / Self Care | Attending: Gastroenterology

## 2022-11-24 ENCOUNTER — Ambulatory Visit: Payer: 59 | Admitting: Anesthesiology

## 2022-11-24 ENCOUNTER — Ambulatory Visit
Admission: RE | Admit: 2022-11-24 | Discharge: 2022-11-24 | Disposition: A | Discharge status: Home / Self Care | Payer: 59 | Attending: Gastroenterology | Admitting: Gastroenterology

## 2022-11-24 ENCOUNTER — Encounter: Payer: Self-pay | Admitting: Gastroenterology

## 2022-11-24 DIAGNOSIS — K5730 Diverticulosis of large intestine without perforation or abscess without bleeding: Secondary | ICD-10-CM | POA: Insufficient documentation

## 2022-11-24 DIAGNOSIS — K640 First degree hemorrhoids: Secondary | ICD-10-CM | POA: Diagnosis not present

## 2022-11-24 DIAGNOSIS — I10 Essential (primary) hypertension: Secondary | ICD-10-CM | POA: Diagnosis not present

## 2022-11-24 DIAGNOSIS — M109 Gout, unspecified: Secondary | ICD-10-CM | POA: Diagnosis not present

## 2022-11-24 DIAGNOSIS — K219 Gastro-esophageal reflux disease without esophagitis: Secondary | ICD-10-CM | POA: Insufficient documentation

## 2022-11-24 DIAGNOSIS — G4730 Sleep apnea, unspecified: Secondary | ICD-10-CM | POA: Insufficient documentation

## 2022-11-24 DIAGNOSIS — R7303 Prediabetes: Secondary | ICD-10-CM | POA: Insufficient documentation

## 2022-11-24 DIAGNOSIS — Z87891 Personal history of nicotine dependence: Secondary | ICD-10-CM | POA: Diagnosis not present

## 2022-11-24 DIAGNOSIS — K644 Residual hemorrhoidal skin tags: Secondary | ICD-10-CM | POA: Diagnosis not present

## 2022-11-24 DIAGNOSIS — K5150 Left sided colitis without complications: Secondary | ICD-10-CM | POA: Diagnosis not present

## 2022-11-24 HISTORY — PX: FLEXIBLE SIGMOIDOSCOPY: SHX5431

## 2022-11-24 SURGERY — SIGMOIDOSCOPY, FLEXIBLE
Anesthesia: General

## 2022-11-24 MED ORDER — SODIUM CHLORIDE 0.9 % IV SOLN: INTRAVENOUS | Status: DC

## 2022-11-24 MED ORDER — SODIUM CHLORIDE 0.9 % IV SOLN
INTRAVENOUS | Status: DC | PRN
Start: 2022-11-24 — End: 2022-11-24

## 2022-11-24 MED ORDER — ONDANSETRON HCL 4 MG/2ML IJ SOLN
INTRAMUSCULAR | Status: DC | PRN
  Administered 2022-11-24: 4 mg via INTRAVENOUS

## 2022-11-24 MED ORDER — PROPOFOL 500 MG/50ML IV EMUL
INTRAVENOUS | Status: DC | PRN
  Administered 2022-11-24: 50 mg via INTRAVENOUS
  Administered 2022-11-24: 150 ug/kg/min via INTRAVENOUS
  Administered 2022-11-24: 50 mg via INTRAVENOUS

## 2022-11-24 NOTE — Interval H&P Note (Signed)
History and Physical Interval Note: Preprocedure H&P from 11/24/22  was reviewed and there was no interval change after seeing and examining the patient.  Written consent was obtained from the patient after discussion of risks, benefits, and alternatives. Patient has consented to proceed with Flexible Sigmoidoscopy with possible intervention   11/24/2022 1:00 PM  Larry Spencer  has presented today for surgery, with the diagnosis of 556.5 (ICD-9-CM) - K51.50 (ICD-10-CM) - Left sided colitis, without complications (CMS/HHS-HCC).  The various methods of treatment have been discussed with the patient and family. After consideration of risks, benefits and other options for treatment, the patient has consented to  Procedure(s): FLEXIBLE SIGMOIDOSCOPY (N/A) as a surgical intervention.  The patient's history has been reviewed, patient examined, no change in status, stable for surgery.  I have reviewed the patient's chart and labs.  Questions were answered to the patient's satisfaction.     [name]

## 2022-11-24 NOTE — Transfer of Care (Signed)
Immediate Anesthesia Transfer of Care Note  Patient: Larry Spencer  Procedure(s) Performed: FLEXIBLE SIGMOIDOSCOPY  Patient Location: PACU  Anesthesia Type:MAC  Level of Consciousness: drowsy  Airway & Oxygen Therapy: Patient Spontanous Breathing and Patient connected to face mask oxygen  Post-op Assessment: Report given to RN and Post -op Vital signs reviewed and stable  Post vital signs: Reviewed  Last Vitals:  Vitals Value Taken Time  BP 107/74 11/24/22 1329  Temp 31F   Pulse 76 11/24/22 1330  Resp 22 11/24/22 1330  SpO2 100 % 11/24/22 1330  Vitals shown include unfiled device data.  Last Pain:  Vitals:   11/24/22 1232  TempSrc: Temporal  PainSc: 0-No pain         Complications: No notable events documented.

## 2022-11-24 NOTE — Op Note (Signed)
[facility]enterology Patient Name: Larry Spencer Procedure Date: 11/24/2022 1:06 PM MRN: [id number] Account #: [id number] Date of Birth: Feb 16, 1961 Admit Type: Outpatient Age: 62 Room: [facility] ENDO ROOM 1 Gender: Male Note Status: Finalized Instrument Name: Prentice Docker [id number] Procedure:             Flexible Sigmoidoscopy Indications:           High risk colon cancer surveillance: Ulcerative left                         sided colitis of 8 (or more) years duration Providers:             Trenda Moots, DO Referring MD:          Clydie Braun (Referring MD) Medicines:             Monitored Anesthesia Care Complications:         No immediate complications. Estimated blood loss: None. Procedure:             Pre-Anesthesia Assessment:                        - Prior to the procedure, a History and Physical was                         performed, and patient medications and allergies were                         reviewed. The patient is competent. The risks and                         benefits of the procedure and the sedation options and                         risks were discussed with the patient. All questions                         were answered and informed consent was obtained.                         Patient identification and proposed procedure were                         verified by the physician, the nurse, the anesthetist                         and the technician in the endoscopy suite. Mental                         Status Examination: alert and oriented. Airway                         Examination: normal oropharyngeal airway and neck                         mobility. Respiratory Examination: clear to                         auscultation. CV Examination: RRR, no murmurs, no S3  or S4. Prophylactic Antibiotics: The patient does not                         require prophylactic antibiotics. Prior                          Anticoagulants: The patient has taken no anticoagulant                         or antiplatelet agents. ASA Grade Assessment: III - A                         patient with severe systemic disease. After reviewing                         the risks and benefits, the patient was deemed in                         satisfactory condition to undergo the procedure. The                         anesthesia plan was to use monitored anesthesia care                         (MAC). Immediately prior to administration of                         medications, the patient was re-assessed for adequacy                         to receive sedatives. The heart rate, respiratory                         rate, oxygen saturations, blood pressure, adequacy of                         pulmonary ventilation, and response to care were                         monitored throughout the procedure. The physical                         status of the patient was re-assessed after the                         procedure.                        After obtaining informed consent, the scope was passed                         under direct vision. The Colonoscope was introduced                         through the anus and advanced to the the descending                         colon. The flexible sigmoidoscopy was accomplished  without difficulty. The patient tolerated the                         procedure well. The quality of the bowel preparation                         was good. Findings:      The perianal and digital rectal examinations were normal. Pertinent       negatives include normal sphincter tone.      Non-bleeding internal hemorrhoids were found during retroflexion. The       hemorrhoids were Grade I (internal hemorrhoids that do not prolapse).       Estimated blood loss: none.      Multiple small-mouthed diverticula were found in the recto-sigmoid colon       and sigmoid colon. Estimated blood loss:  none.      A tattoo was seen at 25 cm proximal to the anus. The tattoo site       appeared normal. No signs of previously described inflammation or signs       of any lesion. Impression:            - Non-bleeding internal hemorrhoids.                        - Diverticulosis in the recto-sigmoid colon and in the                         sigmoid colon.                        - A tattoo was seen at 25 cm proximal to the anus. The                         tattoo site appeared normal.                        - No specimens collected. Recommendation:        - Discharge patient to home.                        - Resume previous diet.                        - Continue present medications.                        - Repeat colonoscopy in 2 years for Ulcerative colitis                         surveillance                        - Return to GI clinic as previously scheduled.                        - The findings and recommendations were discussed with                         the patient. Procedure Code(s):     --- Professional ---  03474, Sigmoidoscopy, flexible; diagnostic, including                         collection of specimen(s) by brushing or washing, when                         performed (separate procedure) Diagnosis Code(s):     --- Professional ---                        K51.50, Left sided colitis without complications                        K64.0, First degree hemorrhoids                        K57.30, Diverticulosis of large intestine without                         perforation or abscess without bleeding CPT copyright 2022 [employer] Association. All rights reserved. The codes documented in this report are preliminary and upon coder review may  be revised to meet current compliance requirements. Attending Participation:      I personally performed the entire procedure. Elfredia Nevins, DO [name] DO, DO 11/24/2022 1:46:16 PM This report has been  signed electronically. Number of Addenda: 0 Note Initiated On: 11/24/2022 1:06 PM Total Procedure Duration: 0 hours 8 minutes 11 seconds  Estimated Blood Loss:  Estimated blood loss: none.      [facility]

## 2022-11-24 NOTE — Anesthesia Preprocedure Evaluation (Addendum)
Anesthesia Evaluation  Patient identified by MRN, date of birth, ID band Patient awake    Reviewed: Allergy & Precautions, NPO status , Patient's Chart, lab work & pertinent test results  History of Anesthesia Complications Negative for: history of anesthetic complications  Airway Mallampati: I   Neck ROM: Full    Dental  (+) Missing   Pulmonary sleep apnea , former smoker (quit 2003)   Pulmonary exam normal breath sounds clear to auscultation       Cardiovascular hypertension, Normal cardiovascular exam Rhythm:Regular Rate:Normal     Neuro/Psych negative neurological ROS     GI/Hepatic ,GERD  ,,  Endo/Other  Prediabetes   Renal/GU negative Renal ROS     Musculoskeletal  (+) Arthritis ,  Gout    Abdominal   Peds  Hematology negative hematology ROS (+)   Anesthesia Other Findings   Reproductive/Obstetrics                             Anesthesia Physical Anesthesia Plan  ASA: 2  Anesthesia Plan: General   Post-op Pain Management:    Induction: Intravenous  PONV Risk Score and Plan: 2 and Propofol infusion, TIVA and Treatment may vary due to age or medical condition  Airway Management Planned: Natural Airway  Additional Equipment:   Intra-op Plan:   Post-operative Plan:   Informed Consent: I have reviewed the patients History and Physical, chart, labs and discussed the procedure including the risks, benefits and alternatives for the proposed anesthesia with the patient or authorized representative who has indicated his/her understanding and acceptance.       Plan Discussed with: CRNA  Anesthesia Plan Comments: (LMA/GETA backup discussed.  Patient consented for risks of anesthesia including but not limited to:  - adverse reactions to medications - damage to eyes, teeth, lips or other oral mucosa - nerve damage due to positioning  - sore throat or hoarseness - damage to  heart, brain, nerves, lungs, other parts of body or loss of life  Informed patient about role of CRNA in peri- and intra-operative care.  Patient voiced understanding.)        Anesthesia Quick Evaluation

## 2022-11-25 ENCOUNTER — Encounter: Payer: Self-pay | Admitting: Gastroenterology

## 2022-11-25 NOTE — Anesthesia Postprocedure Evaluation (Signed)
Anesthesia Post Note  Patient: Larry Spencer  Procedure(s) Performed: FLEXIBLE SIGMOIDOSCOPY  Patient location during evaluation: PACU Anesthesia Type: General Level of consciousness: awake and alert, oriented and patient cooperative Pain management: pain level controlled Vital Signs Assessment: post-procedure vital signs reviewed and stable Respiratory status: spontaneous breathing, nonlabored ventilation and respiratory function stable Cardiovascular status: blood pressure returned to baseline and stable Postop Assessment: adequate PO intake Anesthetic complications: no   No notable events documented.   Last Vitals:  Vitals:   11/24/22 1338 11/24/22 1349  BP: 137/88 (!) 149/83  Pulse: 76 62  Resp: 15 (!) 22  Temp:    SpO2: 99% 100%    Last Pain:  Vitals:   11/25/22 0720  TempSrc:   PainSc: 0-No pain                 Reed Breech

## 2022-12-02 DIAGNOSIS — I10 Essential (primary) hypertension: Secondary | ICD-10-CM | POA: Diagnosis not present

## 2022-12-02 DIAGNOSIS — F32A Depression, unspecified: Secondary | ICD-10-CM | POA: Diagnosis not present

## 2023-01-03 DIAGNOSIS — R7303 Prediabetes: Secondary | ICD-10-CM | POA: Diagnosis not present

## 2023-01-03 DIAGNOSIS — F32A Depression, unspecified: Secondary | ICD-10-CM | POA: Diagnosis not present

## 2023-01-03 DIAGNOSIS — I10 Essential (primary) hypertension: Secondary | ICD-10-CM | POA: Diagnosis not present

## 2023-01-03 DIAGNOSIS — M109 Gout, unspecified: Secondary | ICD-10-CM | POA: Diagnosis not present

## 2023-01-03 DIAGNOSIS — E7800 Pure hypercholesterolemia, unspecified: Secondary | ICD-10-CM | POA: Diagnosis not present

## 2023-01-11 ENCOUNTER — Ambulatory Visit: Payer: 59 | Admitting: Dermatology

## 2023-01-11 ENCOUNTER — Encounter: Payer: Self-pay | Admitting: Dermatology

## 2023-01-11 DIAGNOSIS — L578 Other skin changes due to chronic exposure to nonionizing radiation: Secondary | ICD-10-CM | POA: Diagnosis not present

## 2023-01-11 DIAGNOSIS — D1801 Hemangioma of skin and subcutaneous tissue: Secondary | ICD-10-CM | POA: Diagnosis not present

## 2023-01-11 DIAGNOSIS — L814 Other melanin hyperpigmentation: Secondary | ICD-10-CM

## 2023-01-11 DIAGNOSIS — L821 Other seborrheic keratosis: Secondary | ICD-10-CM

## 2023-01-11 DIAGNOSIS — Z808 Family history of malignant neoplasm of other organs or systems: Secondary | ICD-10-CM | POA: Diagnosis not present

## 2023-01-11 DIAGNOSIS — D3612 Benign neoplasm of peripheral nerves and autonomic nervous system, upper limb, including shoulder: Secondary | ICD-10-CM

## 2023-01-11 DIAGNOSIS — W908XXA Exposure to other nonionizing radiation, initial encounter: Secondary | ICD-10-CM | POA: Diagnosis not present

## 2023-01-11 DIAGNOSIS — D229 Melanocytic nevi, unspecified: Secondary | ICD-10-CM

## 2023-01-11 DIAGNOSIS — L570 Actinic keratosis: Secondary | ICD-10-CM

## 2023-01-11 DIAGNOSIS — Z1283 Encounter for screening for malignant neoplasm of skin: Secondary | ICD-10-CM

## 2023-01-11 NOTE — Patient Instructions (Addendum)

## 2023-01-11 NOTE — Progress Notes (Signed)
Follow-Up Visit   Subjective  Larry Spencer is a 62 y.o. male who presents for the following: Skin Cancer Screening and Full Body Skin Exam. No personal Hx of skin cancer. Father and brother have had skin cancer. NMSC  The patient presents for Total-Body Skin Exam (TBSE) for skin cancer screening and mole check. The patient has spots, moles and lesions to be evaluated, some may be new or changing and the patient may have concern these could be cancer.    The following portions of the chart were reviewed this encounter and updated as appropriate: medications, allergies, medical history  Review of Systems:  No other skin or systemic complaints except as noted in HPI or Assessment and Plan.  Objective  Well appearing patient in no apparent distress; mood and affect are within normal limits.  A full examination was performed including scalp, head, eyes, ears, nose, lips, neck, chest, axillae, abdomen, back, buttocks, bilateral upper extremities, bilateral lower extremities, hands, feet, fingers, toes, fingernails, and toenails. All findings within normal limits unless otherwise noted below.   Relevant physical exam findings are noted in the Assessment and Plan.  Right Temple x2, Scalp x1, left temple x1 (4) Erythematous thin papules/macules with gritty scale.     Assessment & Plan   FAMILY HISTORY OF SKIN CANCER What type(s): NMSC Who affected: Father and brother   SKIN CANCER SCREENING PERFORMED TODAY.  ACTINIC DAMAGE - Chronic condition, secondary to cumulative UV/sun exposure - diffuse scaly erythematous macules with underlying dyspigmentation - Recommend daily broad spectrum sunscreen SPF 30+ to sun-exposed areas, reapply every 2 hours as needed.  - Staying in the shade or wearing long sleeves, sun glasses (UVA+UVB protection) and wide brim hats (4-inch brim around the entire circumference of the hat) are also recommended for sun protection.  - Call for new or changing  lesions.  LENTIGINES, SEBORRHEIC KERATOSES, HEMANGIOMAS - Benign normal skin lesions - Benign-appearing - Call for any changes  MELANOCYTIC NEVI - Tan-brown and/or pink-flesh-colored symmetric macules and papules - Benign appearing on exam today - Observation - Call clinic for new or changing moles - Recommend daily use of broad spectrum spf 30+ sunscreen to sun-exposed areas.    Neurofibroma Left Shoulder - Anterior; pink soft papule Benign-appearing. Stable compared to previous visit. Observation.  Call clinic for new or changing moles.  Recommend daily use of broad spectrum spf 30+ sunscreen to sun-exposed areas.     AK (actinic keratosis) (4) Right Temple x2, Scalp x1, left temple x1  Actinic keratoses are precancerous spots that appear secondary to cumulative UV radiation exposure/sun exposure over time. They are chronic with expected duration over 1 year. A portion of actinic keratoses will progress to squamous cell carcinoma of the skin. It is not possible to reliably predict which spots will progress to skin cancer and so treatment is recommended to prevent development of skin cancer.  Recommend daily broad spectrum sunscreen SPF 30+ to sun-exposed areas, reapply every 2 hours as needed.  Recommend staying in the shade or wearing long sleeves, sun glasses (UVA+UVB protection) and wide brim hats (4-inch brim around the entire circumference of the hat). Call for new or changing lesions.  Destruction of lesion - Right Temple x2, Scalp x1, left temple x1 (4) Complexity: simple   Destruction method: cryotherapy   Informed consent: discussed and consent obtained   Timeout:  patient name, date of birth, surgical site, and procedure verified Lesion destroyed using liquid nitrogen: Yes   Region frozen until  ice ball extended beyond lesion: Yes   Outcome: patient tolerated procedure well with no complications   Post-procedure details: wound care instructions given   Additional  details:  Prior to procedure, discussed risks of blister formation, small wound, skin dyspigmentation, or rare scar following cryotherapy. Recommend Vaseline ointment to treated areas while healing.    Return in about 1 year (around 01/11/2024) for TBSE.  I, Lawson Radar, CMA, am acting as scribe for Armida Sans, MD.   Documentation: I have reviewed the above documentation for accuracy and completeness, and I agree with the above.  Armida Sans, MD

## 2023-01-12 DIAGNOSIS — K5150 Left sided colitis without complications: Secondary | ICD-10-CM | POA: Diagnosis not present

## 2023-02-07 DIAGNOSIS — H353131 Nonexudative age-related macular degeneration, bilateral, early dry stage: Secondary | ICD-10-CM | POA: Diagnosis not present

## 2023-02-07 DIAGNOSIS — H524 Presbyopia: Secondary | ICD-10-CM | POA: Diagnosis not present

## 2023-02-07 DIAGNOSIS — H52223 Regular astigmatism, bilateral: Secondary | ICD-10-CM | POA: Diagnosis not present

## 2023-02-07 DIAGNOSIS — H2513 Age-related nuclear cataract, bilateral: Secondary | ICD-10-CM | POA: Diagnosis not present

## 2023-02-07 DIAGNOSIS — H5203 Hypermetropia, bilateral: Secondary | ICD-10-CM | POA: Diagnosis not present

## 2023-03-09 DIAGNOSIS — K5150 Left sided colitis without complications: Secondary | ICD-10-CM | POA: Diagnosis not present

## 2023-05-05 DIAGNOSIS — K5150 Left sided colitis without complications: Secondary | ICD-10-CM | POA: Diagnosis not present

## 2023-06-27 DIAGNOSIS — M109 Gout, unspecified: Secondary | ICD-10-CM | POA: Diagnosis not present

## 2023-06-27 DIAGNOSIS — I10 Essential (primary) hypertension: Secondary | ICD-10-CM | POA: Diagnosis not present

## 2023-06-27 DIAGNOSIS — K5150 Left sided colitis without complications: Secondary | ICD-10-CM | POA: Diagnosis not present

## 2023-06-27 DIAGNOSIS — R7303 Prediabetes: Secondary | ICD-10-CM | POA: Diagnosis not present

## 2023-06-27 DIAGNOSIS — E7800 Pure hypercholesterolemia, unspecified: Secondary | ICD-10-CM | POA: Diagnosis not present

## 2023-06-27 DIAGNOSIS — F32A Depression, unspecified: Secondary | ICD-10-CM | POA: Diagnosis not present

## 2023-07-04 DIAGNOSIS — E7800 Pure hypercholesterolemia, unspecified: Secondary | ICD-10-CM | POA: Diagnosis not present

## 2023-07-04 DIAGNOSIS — G4733 Obstructive sleep apnea (adult) (pediatric): Secondary | ICD-10-CM | POA: Diagnosis not present

## 2023-07-04 DIAGNOSIS — F32A Depression, unspecified: Secondary | ICD-10-CM | POA: Diagnosis not present

## 2023-07-04 DIAGNOSIS — Z79899 Other long term (current) drug therapy: Secondary | ICD-10-CM | POA: Diagnosis not present

## 2023-07-04 DIAGNOSIS — I10 Essential (primary) hypertension: Secondary | ICD-10-CM | POA: Diagnosis not present

## 2023-07-04 DIAGNOSIS — R7303 Prediabetes: Secondary | ICD-10-CM | POA: Diagnosis not present

## 2023-07-04 DIAGNOSIS — K5150 Left sided colitis without complications: Secondary | ICD-10-CM | POA: Diagnosis not present

## 2023-08-31 DIAGNOSIS — K5150 Left sided colitis without complications: Secondary | ICD-10-CM | POA: Diagnosis not present

## 2024-01-08 ENCOUNTER — Ambulatory Visit: Admitting: Dermatology

## 2024-01-08 DIAGNOSIS — L570 Actinic keratosis: Secondary | ICD-10-CM | POA: Diagnosis not present

## 2024-01-08 DIAGNOSIS — Z1283 Encounter for screening for malignant neoplasm of skin: Secondary | ICD-10-CM | POA: Diagnosis not present

## 2024-01-08 DIAGNOSIS — W908XXA Exposure to other nonionizing radiation, initial encounter: Secondary | ICD-10-CM | POA: Diagnosis not present

## 2024-01-08 DIAGNOSIS — L814 Other melanin hyperpigmentation: Secondary | ICD-10-CM | POA: Diagnosis not present

## 2024-01-08 DIAGNOSIS — Z808 Family history of malignant neoplasm of other organs or systems: Secondary | ICD-10-CM

## 2024-01-08 DIAGNOSIS — D1801 Hemangioma of skin and subcutaneous tissue: Secondary | ICD-10-CM

## 2024-01-08 DIAGNOSIS — D229 Melanocytic nevi, unspecified: Secondary | ICD-10-CM

## 2024-01-08 DIAGNOSIS — L578 Other skin changes due to chronic exposure to nonionizing radiation: Secondary | ICD-10-CM | POA: Diagnosis not present

## 2024-01-08 DIAGNOSIS — L821 Other seborrheic keratosis: Secondary | ICD-10-CM | POA: Diagnosis not present

## 2024-01-08 NOTE — Progress Notes (Signed)
 Follow-Up Visit   Subjective  Larry Spencer is a 63 y.o. male who presents for the following: Skin Cancer Screening and Full Body Skin Exam Hx of aks and iks  Has spot at left thigh noticed 3 month ago  The patient presents for Total-Body Skin Exam (TBSE) for skin cancer screening and mole check. The patient has spots, moles and lesions to be evaluated, some may be new or changing and the patient may have concern these could be cancer.    The following portions of the chart were reviewed this encounter and updated as appropriate: medications, allergies, medical history  Review of Systems:  No other skin or systemic complaints except as noted in HPI or Assessment and Plan.  Objective  Well appearing patient in no apparent distress; mood and affect are within normal limits.  A full examination was performed including scalp, head, eyes, ears, nose, lips, neck, chest, axillae, abdomen, back, buttocks, bilateral upper extremities, bilateral lower extremities, hands, feet, fingers, toes, fingernails, and toenails. All findings within normal limits unless otherwise noted below.   Relevant physical exam findings are noted in the Assessment and Plan.  Left wrist x 1 Erythematous thin papules/macules with gritty scale.   Assessment & Plan   FAMILY HISTORY OF SKIN CANCER What type(s): NMSC Who affected: Father and brother   SKIN CANCER SCREENING PERFORMED TODAY.  ACTINIC DAMAGE - Chronic condition, secondary to cumulative UV/sun exposure - diffuse scaly erythematous macules with underlying dyspigmentation - Recommend daily broad spectrum sunscreen SPF 30+ to sun-exposed areas, reapply every 2 hours as needed.  - Staying in the shade or wearing long sleeves, sun glasses (UVA+UVB protection) and wide brim hats (4-inch brim around the entire circumference of the hat) are also recommended for sun protection.  - Call for new or changing lesions.  LENTIGINES, SEBORRHEIC KERATOSES,  HEMANGIOMAS - Benign normal skin lesions - Benign-appearing - Call for any changes - sks at elbows, back, legs Including left thigh   MELANOCYTIC NEVI - Tan-brown and/or pink-flesh-colored symmetric macules and papules - Benign appearing on exam today - Observation - Call clinic for new or changing moles - Recommend daily use of broad spectrum spf 30+ sunscreen to sun-exposed areas.       ACTINIC KERATOSIS Left wrist x 1 Actinic keratoses are precancerous spots that appear secondary to cumulative UV radiation exposure/sun exposure over time. They are chronic with expected duration over 1 year. A portion of actinic keratoses will progress to squamous cell carcinoma of the skin. It is not possible to reliably predict which spots will progress to skin cancer and so treatment is recommended to prevent development of skin cancer.  Recommend daily broad spectrum sunscreen SPF 30+ to sun-exposed areas, reapply every 2 hours as needed.  Recommend staying in the shade or wearing long sleeves, sun glasses (UVA+UVB protection) and wide brim hats (4-inch brim around the entire circumference of the hat). Call for new or changing lesions. Destruction of lesion - Left wrist x 1 Complexity: simple   Destruction method: cryotherapy   Informed consent: discussed and consent obtained   Timeout:  patient name, date of birth, surgical site, and procedure verified Lesion destroyed using liquid nitrogen: Yes   Region frozen until ice ball extended beyond lesion: Yes   Outcome: patient tolerated procedure well with no complications   Post-procedure details: wound care instructions given    Return in about 1 year (around 01/07/2025) for TBSE.  I, Eleanor Blush, CMA, am acting as scribe for  Alm Rhyme, MD.   Documentation: I have reviewed the above documentation for accuracy and completeness, and I agree with the above.  Alm Rhyme, MD

## 2024-01-08 NOTE — Patient Instructions (Addendum)
 Cryotherapy Aftercare  Wash gently with soap and water everyday.   Apply Vaseline and Band-Aid daily until healed.     Actinic keratoses are precancerous spots that appear secondary to cumulative UV radiation exposure/sun exposure over time. They are chronic with expected duration over 1 year. A portion of actinic keratoses will progress to squamous cell carcinoma of the skin. It is not possible to reliably predict which spots will progress to skin cancer and so treatment is recommended to prevent development of skin cancer.  Recommend daily broad spectrum sunscreen SPF 30+ to sun-exposed areas, reapply every 2 hours as needed.  Recommend staying in the shade or wearing long sleeves, sun glasses (UVA+UVB protection) and wide brim hats (4-inch brim around the entire circumference of the hat). Call for new or changing lesions.     Seborrheic Keratosis  What causes seborrheic keratoses? Seborrheic keratoses are harmless, common skin growths that first appear during adult life.  As time goes by, more growths appear.  Some people may develop a large number of them.  Seborrheic keratoses appear on both covered and uncovered body parts.  They are not caused by sunlight.  The tendency to develop seborrheic keratoses can be inherited.  They vary in color from skin-colored to gray, brown, or even black.  They can be either smooth or have a rough, warty surface.   Seborrheic keratoses are superficial and look as if they were stuck on the skin.  Under the microscope this type of keratosis looks like layers upon layers of skin.  That is why at times the top layer may seem to fall off, but the rest of the growth remains and re-grows.    Treatment Seborrheic keratoses do not need to be treated, but can easily be removed in the office.  Seborrheic keratoses often cause symptoms when they rub on clothing or jewelry.  Lesions can be in the way of shaving.  If they become inflamed, they can cause itching,  soreness, or burning.  Removal of a seborrheic keratosis can be accomplished by freezing, burning, or surgery. If any spot bleeds, scabs, or grows rapidly, please return to have it checked, as these can be an indication of a skin cancer.   Melanoma ABCDEs  Melanoma is the most dangerous type of skin cancer, and is the leading cause of death from skin disease.  You are more likely to develop melanoma if you: Have light-colored skin, light-colored eyes, or red or blond hair Spend a lot of time in the sun Tan regularly, either outdoors or in a tanning bed Have had blistering sunburns, especially during childhood Have a close family member who has had a melanoma Have atypical moles or large birthmarks  Early detection of melanoma is key since treatment is typically straightforward and cure rates are extremely high if we catch it early.   The first sign of melanoma is often a change in a mole or a new dark spot.  The ABCDE system is a way of remembering the signs of melanoma.  A for asymmetry:  The two halves do not match. B for border:  The edges of the growth are irregular. C for color:  A mixture of colors are present instead of an even brown color. D for diameter:  Melanomas are usually (but not always) greater than 6mm - the size of a pencil eraser. E for evolution:  The spot keeps changing in size, shape, and color.  Please check your skin once per month between visits. You  can use a small mirror in front and a large mirror behind you to keep an eye on the back side or your body.   If you see any new or changing lesions before your next follow-up, please call to schedule a visit.  Please continue daily skin protection including broad spectrum sunscreen SPF 30+ to sun-exposed areas, reapplying every 2 hours as needed when you're outdoors.   Staying in the shade or wearing long sleeves, sun glasses (UVA+UVB protection) and wide brim hats (4-inch brim around the entire circumference of  the hat) are also recommended for sun protection.     Due to recent changes in healthcare laws, you may see results of your pathology and/or laboratory studies on MyChart before the doctors have had a chance to review them. We understand that in some cases there may be results that are confusing or concerning to you. Please understand that not all results are received at the same time and often the doctors may need to interpret multiple results in order to provide you with the best plan of care or course of treatment. Therefore, we ask that you please give us  2 business days to thoroughly review all your results before contacting the office for clarification. Should we see a critical lab result, you will be contacted sooner.   If You Need Anything After Your Visit  If you have any questions or concerns for your doctor, please call our main line at [phone] and press option 4 to reach your doctor's medical assistant. If no one answers, please leave a voicemail as directed and we will return your call as soon as possible. Messages left after 4 pm will be answered the following business day.   You may also send us  a message via MyChart. We typically respond to MyChart messages within 1-2 business days.  For prescription refills, please ask your pharmacy to contact our office. Our fax number is [phone].  If you have an urgent issue when the clinic is closed that cannot wait until the next business day, you can page your doctor at the number below.    Please note that while we do our best to be available for urgent issues outside of office hours, we are not available 24/7.   If you have an urgent issue and are unable to reach us , you may choose to seek medical care at your doctor's office, retail clinic, urgent care center, or emergency room.  If you have a medical emergency, please immediately call 911 or go to the emergency department.  Pager Numbers  - Dr. Hester: [phone]  -  Dr. Jackquline: [phone]  - Dr. Claudene: [phone]   - Dr. Raymund: [phone]  In the event of inclement weather, please call our main line at [phone] for an update on the status of any delays or closures.  Dermatology Medication Tips: Please keep the boxes that topical medications come in in order to help keep track of the instructions about where and how to use these. Pharmacies typically print the medication instructions only on the boxes and not directly on the medication tubes.   If your medication is too expensive, please contact our office at [phone] option 4 or send us  a message through MyChart.   We are unable to tell what your co-pay for medications will be in advance as this is different depending on your insurance coverage. However, we may be able to find a substitute medication at lower cost or fill out paperwork to get insurance  to cover a needed medication.   If a prior authorization is required to get your medication covered by your insurance company, please allow us  1-2 business days to complete this process.  Drug prices often vary depending on where the prescription is filled and some pharmacies may offer cheaper prices.  The website www.goodrx.com contains coupons for medications through different pharmacies. The prices here do not account for what the cost may be with help from insurance (it may be cheaper with your insurance), but the website can give you the price if you did not use any insurance.  - You can print the associated coupon and take it with your prescription to the pharmacy.  - You may also stop by our office during regular business hours and pick up a GoodRx coupon card.  - If you need your prescription sent electronically to a different pharmacy, notify our office through [facility] or by phone at [phone] option 4.     Si Usted Necesita Algo Despus de Su Visita  Tambin puede enviarnos un mensaje a travs de Clinical cytogeneticist. Por lo  general respondemos a los mensajes de MyChart en el transcurso de 1 a 2 das hbiles.  Para renovar recetas, por favor pida a su farmacia que se ponga en contacto con nuestra oficina. Randi lakes de fax es [place] [phone].  Si tiene un asunto urgente cuando la clnica est cerrada y que no puede esperar hasta el siguiente da hbil, puede llamar/localizar a su doctor(a) al nmero que aparece a continuacin.   Por favor, tenga en cuenta que aunque hacemos todo lo posible para estar disponibles para asuntos urgentes fuera del horario de [place], no estamos disponibles las 24 horas del da, los 7 [address]  [address] de la [place].   Si tiene un problema urgente y no puede comunicarse con nosotros, puede optar por buscar atencin mdica  en el consultorio de su doctor(a), en una clnica privada, en un centro de atencin urgente o en una sala de emergencias.  Si tiene Engineer, drilling, por favor llame inmediatamente al 911 o vaya a la sala de emergencias.  Nmeros de bper  - Dr. Hester: [phone]  - Dra. Jackquline: [phone]  - Dr. Claudene: [phone]  - Dra. Kitts: [phone]  En caso de inclemencias del [place], por favor llame a nuestra lnea principal al [phone] para una actualizacin sobre el estado de cualquier retraso o cierre.  Consejos para la medicacin en dermatologa: Por favor, guarde las cajas en las que vienen los medicamentos de uso tpico para ayudarle a seguir las instrucciones sobre dnde y cmo usarlos. Las farmacias generalmente imprimen las instrucciones del medicamento slo en las cajas y no directamente en los tubos del [place].   Si su medicamento es muy caro, por favor, pngase en contacto con landry rieger llamando al [phone] y presione la opcin 4 o envenos un mensaje a travs de Clinical cytogeneticist.   No podemos decirle cul ser su copago por los medicamentos por adelantado ya que esto es diferente dependiendo de la cobertura de su seguro. Sin embargo, es  posible que podamos encontrar un medicamento sustituto a Audiological scientist un formulario para que el seguro cubra el medicamento que se considera necesario.   Si se requiere una autorizacin previa para que su compaa de seguros malta su medicamento, por favor permtanos de 1 a 2 das hbiles para completar este proceso.  Los precios de los medicamentos varan con frecuencia dependiendo del Environmental consultant de dnde se surte la receta y  alguna farmacias pueden ofrecer precios ms baratos.  El sitio web www.goodrx.com tiene cupones para medicamentos de Health and safety inspector. Los precios aqu no tienen en cuenta lo que podra costar con la ayuda del seguro (puede ser ms barato con su seguro), pero el sitio web puede darle el precio si no utiliz Tourist information centre manager.  - Puede imprimir el cupn correspondiente y llevarlo con su receta a la farmacia.  - Tambin puede pasar por nuestra oficina durante el horario de atencin regular y Education officer, museum una tarjeta de cupones de GoodRx.  - Si necesita que su receta se enve electrnicamente a una farmacia diferente, informe a nuestra oficina a travs de MyChart de [place] o por telfono llamando al [phone] y presione la opcin 4.

## 2024-01-11 ENCOUNTER — Ambulatory Visit: Payer: 59 | Admitting: Dermatology

## 2024-01-16 ENCOUNTER — Encounter: Payer: Self-pay | Admitting: Dermatology

## 2025-01-08 ENCOUNTER — Ambulatory Visit: Admitting: Dermatology
